# Patient Record
Sex: Male | Born: 1995 | Race: Black or African American | Hispanic: No | Marital: Single | Smoking: Current some day smoker
Health system: Southern US, Community
[De-identification: ages and names within clinical notes are randomized; demographics above are authoritative.]

## PROBLEM LIST (undated history)

## (undated) DIAGNOSIS — J45909 Unspecified asthma, uncomplicated: Secondary | ICD-10-CM

---

## 2014-02-08 ENCOUNTER — Emergency Department (HOSPITAL_COMMUNITY)
Admission: EM | Admit: 2014-02-08 | Discharge: 2014-02-08 | Disposition: A | Discharge status: Home / Self Care | Payer: Self-pay | Attending: Emergency Medicine | Admitting: Emergency Medicine

## 2014-02-08 ENCOUNTER — Encounter (HOSPITAL_COMMUNITY): Payer: Self-pay | Admitting: Emergency Medicine

## 2014-02-08 ENCOUNTER — Emergency Department (HOSPITAL_COMMUNITY): Payer: Self-pay

## 2014-02-08 DIAGNOSIS — B9789 Other viral agents as the cause of diseases classified elsewhere: Secondary | ICD-10-CM

## 2014-02-08 DIAGNOSIS — J40 Bronchitis, not specified as acute or chronic: Secondary | ICD-10-CM

## 2014-02-08 DIAGNOSIS — J069 Acute upper respiratory infection, unspecified: Secondary | ICD-10-CM | POA: Insufficient documentation

## 2014-02-08 DIAGNOSIS — Z720 Tobacco use: Secondary | ICD-10-CM | POA: Insufficient documentation

## 2014-02-08 DIAGNOSIS — J45909 Unspecified asthma, uncomplicated: Secondary | ICD-10-CM | POA: Insufficient documentation

## 2014-02-08 HISTORY — DX: Unspecified asthma, uncomplicated: J45.909

## 2014-02-08 MED ORDER — PROMETHAZINE-DM 6.25-15 MG/5ML PO SYRP: 5.0000 mL | ORAL_SOLUTION | Freq: Four times a day (QID) | ORAL | Status: AC | PRN

## 2014-02-08 MED ORDER — ALBUTEROL SULFATE HFA 108 (90 BASE) MCG/ACT IN AERS
2.0000 | INHALATION_SPRAY | RESPIRATORY_TRACT | Status: DC | PRN
  Administered 2014-02-08: 2 via RESPIRATORY_TRACT
  Filled 2014-02-08: qty 6.7

## 2014-02-08 MED ORDER — IPRATROPIUM BROMIDE 0.02 % IN SOLN
0.5000 mg | Freq: Once | RESPIRATORY_TRACT | Status: AC
  Administered 2014-02-08: 0.5 mg via RESPIRATORY_TRACT
  Filled 2014-02-08: qty 2.5

## 2014-02-08 MED ORDER — PREDNISONE 20 MG PO TABS
60.0000 mg | ORAL_TABLET | Freq: Once | ORAL | Status: AC
  Administered 2014-02-08: 60 mg via ORAL
  Filled 2014-02-08: qty 3

## 2014-02-08 MED ORDER — PREDNISONE 50 MG PO TABS: 50.0000 mg | ORAL_TABLET | Freq: Every day | ORAL | Status: DC

## 2014-02-08 MED ORDER — ALBUTEROL SULFATE (2.5 MG/3ML) 0.083% IN NEBU
5.0000 mg | INHALATION_SOLUTION | Freq: Once | RESPIRATORY_TRACT | Status: AC
  Administered 2014-02-08: 5 mg via RESPIRATORY_TRACT
  Filled 2014-02-08: qty 6

## 2014-02-08 NOTE — ED Notes (Signed)
Finished TReatment.

## 2014-02-08 NOTE — ED Notes (Signed)
Pt reports productive cough with yellow sputum x 3 weeks. Denies CP, SOB. Denies fever/chills. Pts lung clear. 100% RA.

## 2014-02-08 NOTE — ED Provider Notes (Signed)
Medical screening examination/treatment/procedure(s) were performed by non-physician practitioner and as supervising physician I was immediately available for consultation/collaboration.   EKG Interpretation None        Courtney F Horton, MD 02/08/14 1835 

## 2014-02-08 NOTE — Discharge Instructions (Signed)
Return here as needed. Follow up with a primary doctor or urgent care. Your chest x-ray was normal. Increase your fluid intake. Rest as much as possible.

## 2014-02-08 NOTE — ED Provider Notes (Signed)
CSN: [id number]     Arrival date & time 02/08/14  1303 History   First MD Initiated Contact with Patient 02/08/14 1314  This chart was scribed for non-physician practitioner, Charlestine Nighthristopher Breshay Ilg, PA-C, working with Shon Batonourtney F Horton, MD by Charline BillsEssence Howell, ED Scribe. This patient was seen in room TR06C/TR06C and the patient's care was started at 1:16 PM.   Chief Complaint  Patient presents with  . Cough   The history is provided by the patient. No language interpreter was used.   HPI Comments: Martin Owens is a 18 y.o. male, with a h/o asthma, who presents to the Emergency Department complaining of gradually worsening productive cough over the past 3 weeks. He reports associated rhinorrhea, sore throat, HA. Pt has tried Theraflu and Mucinex without relief. Pt has a PCP but not in this area. Allergy to Tusisn.  Past Medical History  Diagnosis Date  . Asthma    History reviewed. No pertinent past surgical history. No family history on file. History  Substance Use Topics  . Smoking status: Current Every Day Smoker -- 1.00 packs/day    Types: Cigarettes  . Smokeless tobacco: Not on file  . Alcohol Use: No    Review of Systems  HENT: Positive for rhinorrhea and sore throat.   Respiratory: Positive for cough.   Neurological: Positive for headaches.  All other systems reviewed and are negative.  Allergies  Tussin  Home Medications   Prior to Admission medications   Not on File   Triage Vitals: BP 148/69  Pulse 74  Temp(Src) 98.4 F (36.9 C) (Oral)  Resp 18  SpO2 100% Physical Exam  Nursing note and vitals reviewed. Constitutional: He is oriented to person, place, and time. He appears well-developed and well-nourished.  HENT:  Head: Normocephalic and atraumatic.  Mouth/Throat: Oropharynx is clear and moist.  Eyes: Pupils are equal, round, and reactive to light.  Neck: Normal range of motion. Neck supple.  Cardiovascular: Normal rate, regular rhythm and normal heart sounds.    Pulmonary/Chest: Effort normal. He has no wheezes.  Coarse breath sounds  Musculoskeletal: Normal range of motion.  Neurological: He is alert and oriented to person, place, and time.  Skin: Skin is warm and dry.  Psychiatric: He has a normal mood and affect. His behavior is normal.   ED Course  Procedures (including critical care time) DIAGNOSTIC STUDIES: Oxygen Saturation is 100% on RA, normal by my interpretation.    COORDINATION OF CARE: 1:18 PM-Discussed treatment plan which includes CXR with pt at bedside and pt agreed to plan.   Patient will be treated for URI. Told to return here as needed. Follow up with a PCP or urgent care. Imaging Review Dg Chest 2 View  02/08/2014   CLINICAL DATA:  Cough for 3 weeks.  Smoker.  EXAM: CHEST  2 VIEW  COMPARISON:  None.  FINDINGS: Heart size and mediastinal contours are within normal limits. Both lungs are clear. Visualized skeletal structures are unremarkable.  IMPRESSION: Negative exam.   Electronically Signed   By: Drusilla Kannerhomas  Dalessio M.D.   On: 02/08/2014 14:29      I personally performed the services described in this documentation, which was scribed in my presence. The recorded information has been reviewed and is accurate.    Carlyle Dollyhristopher W Guiseppe Flanagan, PA-C 02/08/14 1434

## 2014-02-08 NOTE — ED Notes (Signed)
PA at the bedside.

## 2015-08-10 ENCOUNTER — Emergency Department (HOSPITAL_COMMUNITY)
Admission: EM | Admit: 2015-08-10 | Discharge: 2015-08-10 | Disposition: A | Discharge status: Home / Self Care | Payer: Self-pay | Attending: Emergency Medicine | Admitting: Emergency Medicine

## 2015-08-10 ENCOUNTER — Emergency Department (HOSPITAL_COMMUNITY): Payer: Self-pay

## 2015-08-10 ENCOUNTER — Encounter (HOSPITAL_COMMUNITY): Payer: Self-pay

## 2015-08-10 DIAGNOSIS — J9801 Acute bronchospasm: Secondary | ICD-10-CM

## 2015-08-10 DIAGNOSIS — J45901 Unspecified asthma with (acute) exacerbation: Secondary | ICD-10-CM | POA: Insufficient documentation

## 2015-08-10 DIAGNOSIS — B349 Viral infection, unspecified: Secondary | ICD-10-CM | POA: Insufficient documentation

## 2015-08-10 DIAGNOSIS — Z7952 Long term (current) use of systemic steroids: Secondary | ICD-10-CM | POA: Insufficient documentation

## 2015-08-10 DIAGNOSIS — R109 Unspecified abdominal pain: Secondary | ICD-10-CM | POA: Insufficient documentation

## 2015-08-10 DIAGNOSIS — F17210 Nicotine dependence, cigarettes, uncomplicated: Secondary | ICD-10-CM | POA: Insufficient documentation

## 2015-08-10 DIAGNOSIS — R630 Anorexia: Secondary | ICD-10-CM | POA: Insufficient documentation

## 2015-08-10 MED ORDER — ALBUTEROL SULFATE HFA 108 (90 BASE) MCG/ACT IN AERS
2.0000 | INHALATION_SPRAY | RESPIRATORY_TRACT | Status: DC | PRN
  Administered 2015-08-10: 2 via RESPIRATORY_TRACT
  Filled 2015-08-10: qty 6.7

## 2015-08-10 MED ORDER — PREDNISONE 20 MG PO TABS: ORAL_TABLET | ORAL | Status: AC

## 2015-08-10 MED ORDER — BENZONATATE 100 MG PO CAPS: 100.0000 mg | ORAL_CAPSULE | Freq: Three times a day (TID) | ORAL | Status: AC

## 2015-08-10 NOTE — ED Provider Notes (Signed)
CSN: [id number]     Arrival date & time 08/10/15  1208 History  By signing my name below, I, Martin Owens, attest that this documentation has been prepared under the direction and in the presence of non-physician practitioner, [name], PA-C. Electronically Signed: [name], Scribe. 08/10/2015. 1:48 PM.    Chief Complaint  Patient presents with  . Cough  . Nasal Congestion   The history is provided by the patient. No language interpreter was used.     HPI Comments:  Martin Owens is a 20 y.o. male with a history of asthma, who presents to the Emergency Department complaining of persistent dry cough x 1 week.  He reports associated wheezing, subjective fever, CP secondary to cough, sore throat, HA, abdominal pain, decrease appetite and an episode of epistaxis that has resolved at this time. Pt is a current everyday smoker.  He denies h/o intubation or ICU hospitalization secondary to asthma. No alleviating factors noted; no treatments tried.  Past Medical History  Diagnosis Date  . Asthma    History reviewed. No pertinent past surgical history. No family history on file. Social History  Substance Use Topics  . Smoking status: Current Every Day Smoker -- 1.00 packs/day    Types: Cigarettes  . Smokeless tobacco: None  . Alcohol Use: No    Review of Systems  Constitutional: Positive for fever and appetite change.  HENT: Positive for nosebleeds (resolved) and sore throat.   Respiratory: Positive for cough and wheezing.   Cardiovascular: Positive for chest pain.  Gastrointestinal: Positive for abdominal pain. Negative for vomiting.  Neurological: Positive for headaches.  All other systems reviewed and are negative.   Allergies  Tussin  Home Medications   Prior to Admission medications   Medication Sig Start Date End Date Taking? Authorizing Provider  predniSONE (DELTASONE) 50 MG tablet Take 1 tablet (50 mg total) by mouth daily. 02/08/14   Charlestine Night, PA-C   promethazine-dextromethorphan (PROMETHAZINE-DM) 6.25-15 MG/5ML syrup Take 5 mLs by mouth 4 (four) times daily as needed for cough. 02/08/14   Christopher Lawyer, PA-C   BP 104/71 mmHg  Pulse 86  Temp(Src) 99.3 F (37.4 C) (Oral)  Resp 18  Ht  (1.727 m)  Wt 158 lb 4.8 oz (71.804 kg)  BMI 24.07 kg/m2  SpO2 97% Physical Exam  Constitutional: He is oriented to person, place, and time. He appears well-developed and well-nourished. No distress.  HENT:  Head: Normocephalic and atraumatic.  Right Ear: Tympanic membrane and external ear normal.  Left Ear: Tympanic membrane and external ear normal.  Eyes: Conjunctivae are normal.  Cardiovascular: Normal rate.   Pulmonary/Chest: Effort normal. He has wheezes (expiratory). He has rhonchi.  Abdominal: He exhibits no distension.  Lymphadenopathy:    He has cervical adenopathy.  Neurological: He is alert and oriented to person, place, and time.  Skin: Skin is warm and dry.  Psychiatric: He has a normal mood and affect.  Nursing note and vitals reviewed.   ED Course  Procedures DIAGNOSTIC STUDIES:  Oxygen Saturation is 97% on RA, normal by my interpretation.    COORDINATION OF CARE:  1:48 PM Discussed treatment plan with pt at bedside and pt agreed to plan.  Imaging Review Dg Chest 2 View  08/10/2015  CLINICAL DATA:  Cold chills,body aches,cough for 1 week,hx asthma,smoker EXAM: CHEST - 2 VIEW COMPARISON:  02/08/2014 FINDINGS: Lungs are clear. Heart size and mediastinal contours are within normal limits. No effusion. Visualized skeletal structures are unremarkable. IMPRESSION: No  acute cardiopulmonary disease. Electronically Signed   By: Corlis Leak  Hassell M.D.   On: 08/10/2015 14:15   I have personally reviewed and evaluated these images and lab results as part of my medical decision-making.    MDM  Pt symptoms consistent with URI. CXR negative for acute infiltrate. Pt will be discharged with symptomatic treatment including albuterol  and prednisone along with cough medication.  Discussed return precautions.  Pt is hemodynamically stable & in NAD prior to discharge.  Final diagnoses:  Acute bronchospasm due to viral infection    BP 104/71 mmHg  Pulse 86  Temp(Src) 99.3 F (37.4 C) (Oral)  Resp 18  Ht 5\' 8"  (1.727 m)  Wt 71.804 kg  BMI 24.07 kg/m2  SpO2 97%   I personally performed the services described in this documentation, which was scribed in my presence. The recorded information has been reviewed and is accurate.      Fayrene HelperBowie Dannilynn Gallina, PA-C 08/10/15 1450  Margarita Grizzleanielle Ray, MD 08/10/15 [phone]

## 2015-08-10 NOTE — ED Notes (Signed)
Patient here with 1 week of cough, congestion and nosebleeds, no distress

## 2015-08-10 NOTE — Discharge Instructions (Signed)
Asthma, Acute Bronchospasm °Acute bronchospasm caused by asthma is also referred to as an asthma attack. Bronchospasm means your air passages become narrowed. The narrowing is caused by inflammation and tightening of the muscles in the air tubes (bronchi) in your lungs. This can make it hard to breathe or cause you to wheeze and cough. °CAUSES °Possible triggers are: °· Animal dander from the skin, hair, or feathers of animals. °· Dust mites contained in house dust. °· Cockroaches. °· Pollen from trees or grass. °· Mold. °· Cigarette or tobacco smoke. °· Air pollutants such as dust, household cleaners, hair sprays, aerosol sprays, paint fumes, strong chemicals, or strong odors. °· Cold air or weather changes. Cold air may trigger inflammation. Winds increase molds and pollens in the air. °· Strong emotions such as crying or laughing hard. °· Stress. °· Certain medicines such as aspirin or beta-blockers. °· Sulfites in foods and drinks, such as dried fruits and wine. °· Infections or inflammatory conditions, such as a flu, cold, or inflammation of the nasal membranes (rhinitis). °· Gastroesophageal reflux disease (GERD). GERD is a condition where stomach acid backs up into your esophagus. °· Exercise or strenuous activity. °SIGNS AND SYMPTOMS  °· Wheezing. °· Excessive coughing, particularly at night. °· Chest tightness. °· Shortness of breath. °DIAGNOSIS  °Your health care provider will ask you about your medical history and perform a physical exam. A chest X-ray or blood testing may be performed to look for other causes of your symptoms or other conditions that may have triggered your asthma attack.  °TREATMENT  °Treatment is aimed at reducing inflammation and opening up the airways in your lungs.  Most asthma attacks are treated with inhaled medicines. These include quick relief or rescue medicines (such as bronchodilators) and controller medicines (such as inhaled corticosteroids). These medicines are sometimes  given through an inhaler or a nebulizer. Systemic steroid medicine taken by mouth or given through an IV tube also can be used to reduce the inflammation when an attack is moderate or severe. Antibiotic medicines are only used if a bacterial infection is present.  °HOME CARE INSTRUCTIONS  °· Rest. °· Drink plenty of liquids. This helps the mucus to remain thin and be easily coughed up. Only use caffeine in moderation and do not use alcohol until you have recovered from your illness. °· Do not smoke. Avoid being exposed to secondhand smoke. °· You play a critical role in keeping yourself in good health. Avoid exposure to things that cause you to wheeze or to have breathing problems. °· Keep your medicines up-to-date and available. Carefully follow your health care provider's treatment plan. °· Take your medicine exactly as prescribed. °· When pollen or pollution is bad, keep windows closed and use an air conditioner or go to places with air conditioning. °· Asthma requires careful medical care. See your health care provider for a follow-up as advised. If you are more than [redacted] weeks pregnant and you were prescribed any new medicines, let your obstetrician know about the visit and how you are doing. Follow up with your health care provider as directed. °· After you have recovered from your asthma attack, make an appointment with your outpatient doctor to talk about ways to reduce the likelihood of future attacks. If you do not have a doctor who manages your asthma, make an appointment with a primary care doctor to discuss your asthma. °SEEK IMMEDIATE MEDICAL CARE IF:  °· You are getting worse. °· You have trouble breathing. If severe, call your local   emergency services (911 in the [place] °· You develop chest pain or discomfort. °· You are vomiting. °· You are not able to keep fluids down. °· You are coughing up yellow, green, brown, or bloody sputum. °· You have a fever and your symptoms suddenly get worse. °· You have  trouble swallowing. °MAKE SURE YOU:  °· Understand these instructions. °· Will watch your condition. °· Will get help right away if you are not doing well or get worse. °  °This information is not intended to replace advice given to you by your health care provider. Make sure you discuss any questions you have with your health care provider. °  °Document Released: 08/08/2006 Document Revised: 04/28/2013 Document Reviewed: 10/29/2012 °Elsevier Interactive Patient Education ©2016 [employer] ° °Viral Infections °A virus is a type of germ. Viruses can cause: °· Minor sore throats. °· Aches and pains. °· Headaches. °· Runny nose. °· Rashes. °· Watery eyes. °· Tiredness. °· Coughs. °· Loss of appetite. °· Feeling sick to your stomach (nausea). °· Throwing up (vomiting). °· Watery poop (diarrhea). °HOME CARE  °· Only take medicines as told by your doctor. °· Drink enough water and fluids to keep your pee (urine) clear or pale yellow. Sports drinks are a good choice. °· Get plenty of rest and eat healthy. Soups and broths with crackers or rice are fine. °GET HELP RIGHT AWAY IF:  °· You have a very bad headache. °· You have shortness of breath. °· You have chest pain or neck pain. °· You have an unusual rash. °· You cannot stop throwing up. °· You have watery poop that does not stop. °· You cannot keep fluids down. °· You or your child has a temperature by mouth above 102° F (38.9° C), not controlled by medicine. °· Your baby is older than 3 months with a rectal temperature of 102° F (38.9° C) or higher. °· Your baby is 3 months old or younger with a rectal temperature of 100.4° F (38° C) or higher. °MAKE SURE YOU:  °· Understand these instructions. °· Will watch this condition. °· Will get help right away if you are not doing well or get worse. °  °This information is not intended to replace advice given to you by your health care provider. Make sure you discuss any questions you have with your health care provider. °    °Document Released: 04/05/2008 Document Revised: 07/16/2011 Document Reviewed: 09/29/2014 °Elsevier Interactive Patient Education ©2016 [employer] ° °

## 2017-01-05 ENCOUNTER — Emergency Department (HOSPITAL_COMMUNITY)
Admission: EM | Admit: 2017-01-05 | Discharge: 2017-01-05 | Disposition: A | Discharge status: Home / Self Care | Payer: Self-pay | Attending: Emergency Medicine | Admitting: Emergency Medicine

## 2017-01-05 ENCOUNTER — Encounter (HOSPITAL_COMMUNITY): Payer: Self-pay | Admitting: *Deleted

## 2017-01-05 ENCOUNTER — Emergency Department (HOSPITAL_COMMUNITY): Payer: Self-pay

## 2017-01-05 DIAGNOSIS — J45909 Unspecified asthma, uncomplicated: Secondary | ICD-10-CM | POA: Insufficient documentation

## 2017-01-05 DIAGNOSIS — R112 Nausea with vomiting, unspecified: Secondary | ICD-10-CM | POA: Insufficient documentation

## 2017-01-05 DIAGNOSIS — F17210 Nicotine dependence, cigarettes, uncomplicated: Secondary | ICD-10-CM | POA: Insufficient documentation

## 2017-01-05 DIAGNOSIS — R1030 Lower abdominal pain, unspecified: Secondary | ICD-10-CM | POA: Insufficient documentation

## 2017-01-05 LAB — CBC WITH DIFFERENTIAL/PLATELET
BASOS ABS: 0 10*3/uL (ref 0.0–0.1)
Basophils Relative: 0 %
Eosinophils Absolute: 0.2 10*3/uL (ref 0.0–0.7)
Eosinophils Relative: 5 %
HCT: 42.1 % (ref 39.0–52.0)
Hemoglobin: 14 g/dL (ref 13.0–17.0)
LYMPHS PCT: 42 %
Lymphs Abs: 1.9 10*3/uL (ref 0.7–4.0)
MCH: 28.5 pg (ref 26.0–34.0)
MCHC: 33.3 g/dL (ref 30.0–36.0)
MCV: 85.6 fL (ref 78.0–100.0)
Monocytes Absolute: 0.4 10*3/uL (ref 0.1–1.0)
Monocytes Relative: 8 %
Neutro Abs: 2.1 10*3/uL (ref 1.7–7.7)
Neutrophils Relative %: 45 %
Platelets: 250 10*3/uL (ref 150–400)
RBC: 4.92 MIL/uL (ref 4.22–5.81)
RDW: 14 % (ref 11.5–15.5)
WBC: 4.6 10*3/uL (ref 4.0–10.5)

## 2017-01-05 LAB — COMPREHENSIVE METABOLIC PANEL
ALBUMIN: 4.1 g/dL (ref 3.5–5.0)
ALT: 24 U/L (ref 17–63)
AST: 29 U/L (ref 15–41)
Alkaline Phosphatase: 46 U/L (ref 38–126)
Anion gap: 7 (ref 5–15)
BUN: 11 mg/dL (ref 6–20)
CO2: 26 mmol/L (ref 22–32)
Calcium: 9.3 mg/dL (ref 8.9–10.3)
Chloride: 108 mmol/L (ref 101–111)
Creatinine, Ser: 1.22 mg/dL (ref 0.61–1.24)
GFR calc Af Amer: >60 mL/min (ref >60)
GFR calc non Af Amer: >60 mL/min (ref >60)
Glucose, Bld: 91 mg/dL (ref 65–99)
POTASSIUM: 4.1 mmol/L (ref 3.5–5.1)
Sodium: 141 mmol/L (ref 135–145)
Total Bilirubin: 0.5 mg/dL (ref 0.3–1.2)
Total Protein: 6.8 g/dL (ref 6.5–8.1)

## 2017-01-05 MED ORDER — SODIUM CHLORIDE 0.9 % IV BOLUS (SEPSIS): 1000.0000 mL | Freq: Once | INTRAVENOUS | Status: DC

## 2017-01-05 MED ORDER — ONDANSETRON HCL 4 MG/2ML IJ SOLN
4.0000 mg | Freq: Once | INTRAMUSCULAR | Status: AC
Start: 2017-01-05 — End: 2017-01-05
  Administered 2017-01-05: 4 mg via INTRAVENOUS
  Filled 2017-01-05: qty 2

## 2017-01-05 MED ORDER — KETOROLAC TROMETHAMINE 30 MG/ML IJ SOLN
30.0000 mg | Freq: Once | INTRAMUSCULAR | Status: AC
  Administered 2017-01-05: 30 mg via INTRAVENOUS
  Filled 2017-01-05: qty 1

## 2017-01-05 MED ORDER — ONDANSETRON 4 MG PO TBDP: 4.0000 mg | ORAL_TABLET | Freq: Three times a day (TID) | ORAL | 0 refills | Status: AC | PRN

## 2017-01-05 MED ORDER — SODIUM CHLORIDE 0.9 % IV BOLUS (SEPSIS)
1000.0000 mL | Freq: Once | INTRAVENOUS | Status: AC
  Administered 2017-01-05: 1000 mL via INTRAVENOUS

## 2017-01-05 MED ORDER — DICYCLOMINE HCL 20 MG PO TABS: 20.0000 mg | ORAL_TABLET | Freq: Two times a day (BID) | ORAL | 0 refills | Status: AC

## 2017-01-05 MED ORDER — IOPAMIDOL (ISOVUE-300) INJECTION 61%
INTRAVENOUS | Status: AC
  Administered 2017-01-05: 100 mL
  Filled 2017-01-05: qty 100

## 2017-01-05 NOTE — ED Notes (Signed)
Patient transported to CT 

## 2017-01-05 NOTE — ED Notes (Signed)
PT denies pain and nausea at this time.  Tolerated po challenge.  PA notified.

## 2017-01-05 NOTE — ED Notes (Signed)
Pt states reduced nausea and pain and states he is ready to go home.  Given po challenge of ginger ale.

## 2017-01-05 NOTE — Discharge Instructions (Signed)
There were no acute abnormalities on the labs or CT scan, including no signs of appendicitis. Your symptoms are consistent with a viral illness. Viruses do not require antibiotics. Treatment is symptomatic care and it is important to note that these symptoms may last for 7-10 days.   Hand washing: Wash your hands throughout the day, but especially before and after touching the face, using the restroom, sneezing, coughing, or touching surfaces that have been coughed or sneezed upon. Hydration: Symptoms will be intensified and complicated by dehydration. Dehydration can also extend the duration of symptoms. Drink plenty of fluids and get plenty of rest. You should be drinking at least half a liter of water an hour to stay hydrated. Electrolyte drinks are also encouraged. You should be drinking enough fluids to make your urine light yellow, almost clear. If this is not the case, you are not drinking enough water. Please note that some of the treatments indicated below will not be effective if you are not adequately hydrated. Pain or fever: Ibuprofen, Naproxen, or Tylenol for pain or fever.  Nausea/vomiting: Use the Zofran for nausea or vomiting.  Bentyl: May use the Bentyl as needed for abdominal discomfort. Follow up: Follow up with a primary care provider, as needed, for any future management of this issue.

## 2017-01-05 NOTE — ED Provider Notes (Signed)
[facility]-EMERGENCY DEPT Provider Note   CSN: [id number] Arrival date & time: 01/05/17  0743     History   Chief Complaint Chief Complaint  Patient presents with  . Emesis  . Headache    HPI Martin Owens is a 21 y.o. male.  The history is provided by the patient.     Martin Owens is a 21 y.o. male, with a history of Asthma, presenting to the ED with Nausea and vomiting for the last 3 days. States he vomits every times he tries to eat or drink and has nausea in between. Also endorses bilateral lower abdominal non-worsening soreness that seems to rise after vomiting. Rates this 7/10, nonradiating. Endorses a temporary headache after vomiting that resolves within a few minutes. Headache is bilateral, throbbing, global, nonradiating. The headache does not arise before vomiting. He has not tried any therapies for his complaints.  Denies diarrhea/constipation, fever, urinary complaints, genital complaints, chest pain, shortness of breath, neck pain/stiffness, neuro deficits, or any other complaints.   Past Medical History:  Diagnosis Date  . Asthma     There are no active problems to display for this patient.   History reviewed. No pertinent surgical history.     Home Medications    Prior to Admission medications   Medication Sig Start Date End Date Taking? Authorizing Provider  benzonatate (TESSALON) 100 MG capsule Take 1 capsule (100 mg total) by mouth every 8 (eight) hours. 08/10/15   [name], PA-C  dicyclomine (BENTYL) 20 MG tablet Take 1 tablet (20 mg total) by mouth 2 (two) times daily. 01/05/17   Benyamin Jeff C, PA-C  ondansetron (ZOFRAN ODT) 4 MG disintegrating tablet Take 1 tablet (4 mg total) by mouth every 8 (eight) hours as needed for nausea or vomiting. 01/05/17   Fynlee Rowlands C, PA-C  predniSONE (DELTASONE) 20 MG tablet 3 tabs po day one, then 2 po daily x 4 days 08/10/15   [name], PA-C  promethazine-dextromethorphan (PROMETHAZINE-DM) 6.25-15 MG/5ML  syrup Take 5 mLs by mouth 4 (four) times daily as needed for cough. 02/08/14   Charlestine Night, PA-C    Family History No family history on file.  Social History Social History  Substance Use Topics  . Smoking status: Current Some Day Smoker    Packs/day: 1.00    Types: Cigarettes  . Smokeless tobacco: Never Used  . Alcohol use No     Allergies   Tussin [guaifenesin]   Review of Systems Review of Systems  Respiratory: Negative for shortness of breath.   Cardiovascular: Negative for chest pain.  Gastrointestinal: Positive for abdominal pain, nausea and vomiting. Negative for constipation and diarrhea.  Genitourinary: Negative for dysuria, frequency and hematuria.  Musculoskeletal: Negative for neck pain and neck stiffness.  Neurological: Positive for headaches. Negative for dizziness, syncope, weakness, light-headedness and numbness.  All other systems reviewed and are negative.    Physical Exam Updated Vital Signs BP (!) 141/89 (BP Location: Right Arm)   Pulse 78   Temp 98 F (36.7 C) (Oral)   Resp 16   Ht 5\' 9"  (1.753 m)   Wt 71.7 kg (158 lb)   SpO2 100%   BMI 23.33 kg/m   Physical Exam  Constitutional: He appears well-developed and well-nourished. No distress.  HENT:  Head: Normocephalic and atraumatic.  Eyes: Conjunctivae are normal.  Neck: Neck supple.  Cardiovascular: Normal rate, regular rhythm, normal heart sounds and intact distal pulses.   Pulmonary/Chest: Effort normal and breath sounds normal. No  respiratory distress.  Abdominal: Soft. Bowel sounds are normal. There is tenderness in the right lower quadrant, suprapubic area and left lower quadrant. There is no guarding.  Patient is relaxed during exam. He verbally indicates tenderness in the indicated areas, but has no other reaction. His tenderness is equal across the indicated area.  Musculoskeletal: He exhibits no edema.  Lymphadenopathy:    He has no cervical adenopathy.  Neurological: He  is alert.  Skin: Skin is warm and dry. He is not diaphoretic.  Psychiatric: He has a normal mood and affect. His behavior is normal.  Nursing note and vitals reviewed.    ED Treatments / Results  Labs (all labs ordered are listed, but only abnormal results are displayed) Labs Reviewed  COMPREHENSIVE METABOLIC PANEL  CBC WITH DIFFERENTIAL/PLATELET  URINALYSIS, ROUTINE W REFLEX MICROSCOPIC    EKG  EKG Interpretation None       Radiology Ct Abdomen Pelvis W Contrast  Result Date: 01/05/2017 CLINICAL DATA:  Lower abdominal pain with nausea and vomiting for 3 days. EXAM: CT ABDOMEN AND PELVIS WITH CONTRAST TECHNIQUE: Multidetector CT imaging of the abdomen and pelvis was performed using the standard protocol following bolus administration of intravenous contrast. CONTRAST:  100mL ISOVUE-300 IOPAMIDOL (ISOVUE-300) INJECTION 61% COMPARISON:  None. FINDINGS: Lower chest: The lung bases are clear. The heart size is normal. No significant pleural or pericardial effusion is present. Hepatobiliary: No focal liver abnormality is seen. No gallstones, gallbladder wall thickening, or biliary dilatation. Pancreas: Unremarkable. No pancreatic ductal dilatation or surrounding inflammatory changes. Spleen: Normal in size without focal abnormality. Adrenals/Urinary Tract: The adrenal glands and kidneys are normal bilaterally. Ureters and urinary bladder are within normal limits. Stomach/Bowel: The stomach and duodenum are normal. The small bowel is unremarkable. The appendix is visualized an normal. The ascending and transverse colon are within normal limits. The descending and sigmoid colon are normal. Vascular/Lymphatic: No significant vascular findings are present. No enlarged abdominal or pelvic lymph nodes. Reproductive: Prostate is unremarkable. Other: No abdominal wall hernia or abnormality. No abdominopelvic ascites. Musculoskeletal: Vertebral body heights and alignment are normal. No focal lytic or  blastic lesions are present. Ventral displacement of the coccyx is remote. IMPRESSION: 1. No acute or focal abnormality to explain the patient's lower abdominal pain, nausea, or vomiting. 2. Appearance of the appendix. 3. Remote injury of the coccyx. Electronically Signed   By: Marin Robertshristopher  Mattern M.D.   On: 01/05/2017 11:03    Procedures Procedures (including critical care time)  Medications Ordered in ED Medications  sodium chloride 0.9 % bolus 1,000 mL ( Intravenous Canceled Entry 01/05/17 1141)  sodium chloride 0.9 % bolus 1,000 mL (0 mLs Intravenous Stopped 01/05/17 1204)  ondansetron (ZOFRAN) injection 4 mg (4 mg Intravenous Given 01/05/17 0826)  ketorolac (TORADOL) 30 MG/ML injection 30 mg (30 mg Intravenous Given 01/05/17 0828)  iopamidol (ISOVUE-300) 61 % injection (100 mLs  Contrast Given 01/05/17 1024)     Initial Impression / Assessment and Plan / ED Course  I have reviewed the triage vital signs and the nursing notes.  Pertinent labs & imaging results that were available during my care of the patient were reviewed by me and considered in my medical decision making (see chart for details).  Clinical Course as of Jan 05 1213  Sat Jan 05, 2017  [id number] Patient states his pain has improved with the Toradol, however, he continues to complain now only in the right lower quadrant. Rates his pain 6/10. Patient continues to be tender in  this region. Patient has not had recurrence of his headache.  [SJ]    Clinical Course User Index [SJ] Travaris Kosh C, PA-C    Patient presents with nausea, vomiting, and abdominal pain. Patient voiced improvement over ED course with interventions. I suspected that his lower abdominal pain was muscular secondary to vomiting and initial plan included labs and serial abdominal exams. However, patient continued to have localized right lower quadrant pain and tenderness, thus a CT scan for appendicitis rule out was thought to be indicated. My suspicion for meningitis  or intracranial pathology is low in this patient.  No acute abnormalities on CT. Suspect possible viral etiology. Oral fluid challenge without difficulty. The patient was given instructions for home care as well as return precautions. Patient voices understanding of these instructions, accepts the plan, and is comfortable with discharge.   Vitals:   01/05/17 0752 01/05/17 0830 01/05/17 0845 01/05/17 0900  BP:  119/77  111/72  Pulse:  65 (!) 55 (!) 56  Resp:      Temp:      TempSrc:      SpO2:  100% 100% 100%  Weight: 71.7 kg (158 lb)     Height: 5\' 9"  (1.753 m)      Vitals:   01/05/17 0845 01/05/17 0900 01/05/17 1000 01/05/17 1154  BP:  111/72 (!) 100/55   Pulse: (!) 55 (!) 56 (!) 54   Resp:    18  Temp:      TempSrc:      SpO2: 100% 100% 100%   Weight:      Height:          Final Clinical Impressions(s) / ED Diagnoses   Final diagnoses:  Non-intractable vomiting with nausea, unspecified vomiting type  Lower abdominal pain    New Prescriptions Discharge Medication List as of 01/05/2017 11:46 AM    START taking these medications   Details  dicyclomine (BENTYL) 20 MG tablet Take 1 tablet (20 mg total) by mouth 2 (two) times daily., Starting Sat 01/05/2017, Print    ondansetron (ZOFRAN ODT) 4 MG disintegrating tablet Take 1 tablet (4 mg total) by mouth every 8 (eight) hours as needed for nausea or vomiting., Starting Sat 01/05/2017, Print         Anselm Pancoast, PA-C 01/05/17 1215    Tegeler, Canary Brim, MD 01/05/17 [phone]

## 2017-01-05 NOTE — ED Triage Notes (Addendum)
Pt states bil lower quadrant abd pain and emesis x 3 days.  He believes the vomiting is causing headaches.  Denies any changes in bowel or bladder habits.

## 2018-10-18 IMAGING — CT CT ABD-PELV W/ CM
2 of 8 series · 12 of 46 positions shown, 18 images · IV contrast (APPLIED)
Comparison: None.

CLINICAL DATA: Lower abdominal pain with nausea and vomiting for 3
days.

EXAM:
CT ABDOMEN AND PELVIS WITH CONTRAST
TECHNIQUE: Multidetector CT imaging of the abdomen and pelvis was performed
using the standard protocol following bolus administration of
intravenous contrast.
CONTRAST:  100mL R6BETN-7AA IOPAMIDOL (R6BETN-7AA) INJECTION 61%

[Series 3: abdomen 5.0 · axial · 0.69mm/px · z∈[+839,+1214]mm · 9 of 93 slices shown, 15 images]
[im 9/93  soft-tissue]
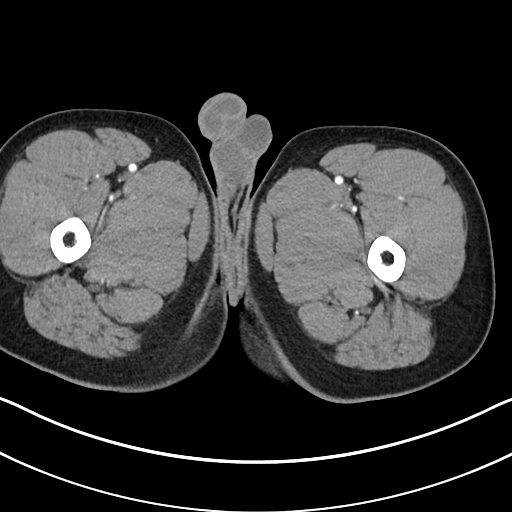
[im 9/93  bone]
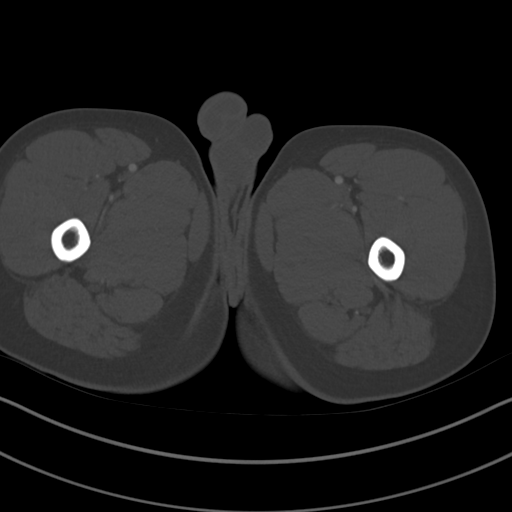
[im 17/93  soft-tissue]
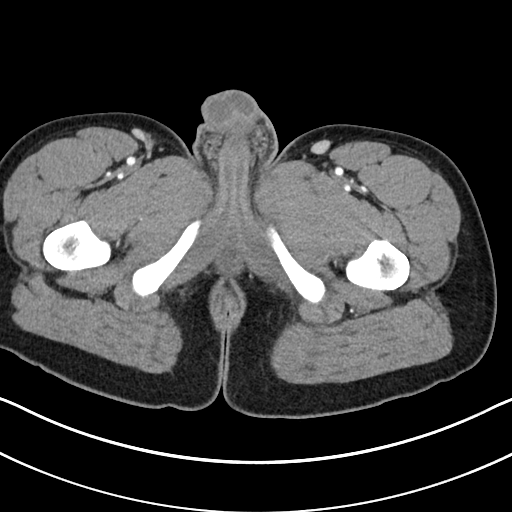
[im 26/93  soft-tissue]
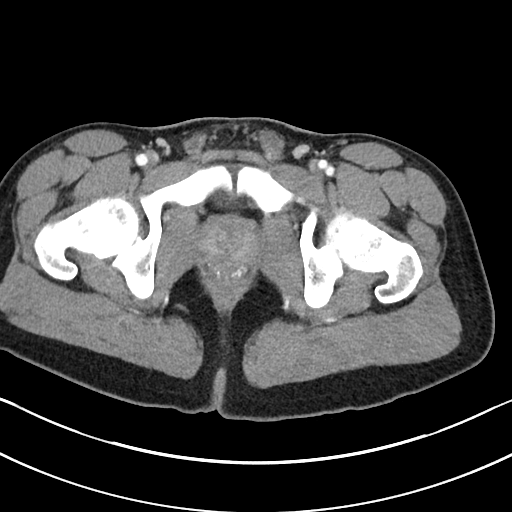
[im 34/93  soft-tissue]
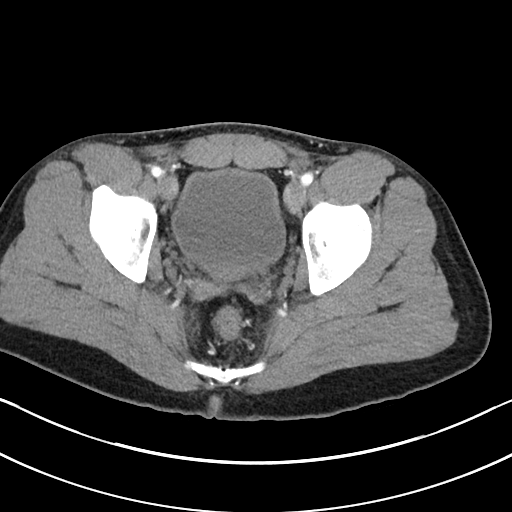
[im 51/93  soft-tissue]
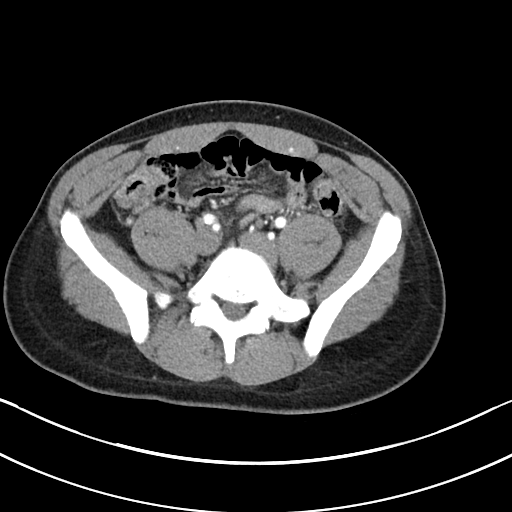
[im 59/93  soft-tissue]
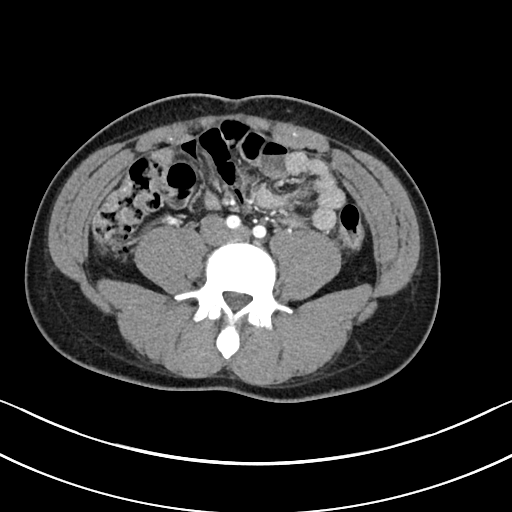
[im 59/93  lung]
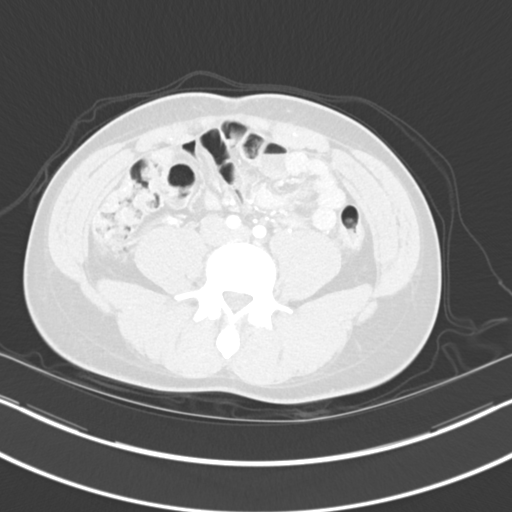
[im 67/93  soft-tissue]
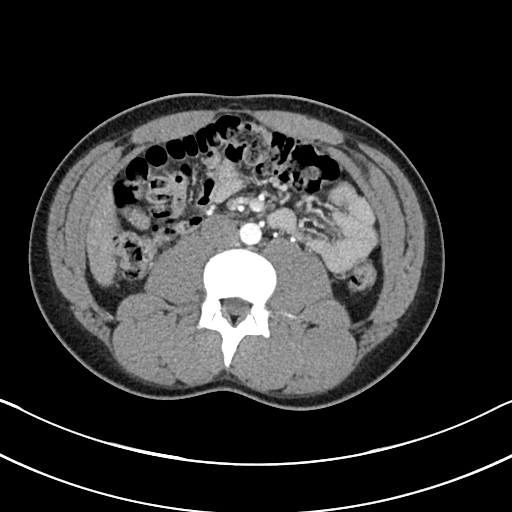
[im 67/93  lung]
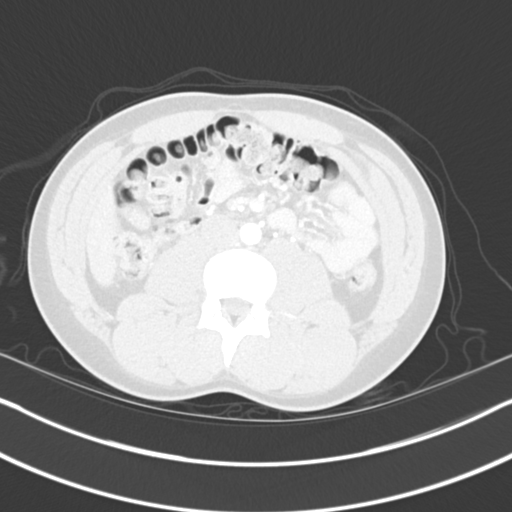
[im 76/93  soft-tissue]
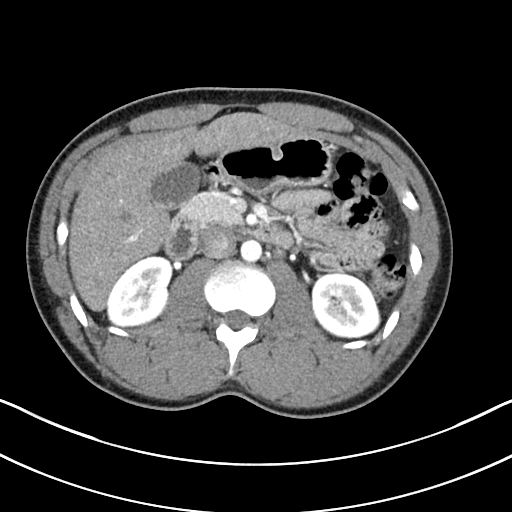
[im 76/93  lung]
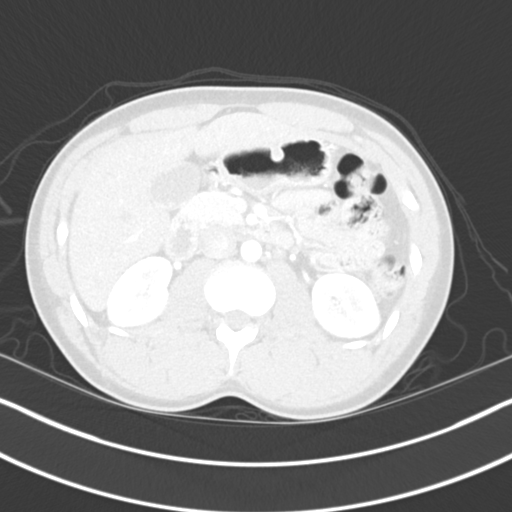
[im 84/93  soft-tissue]
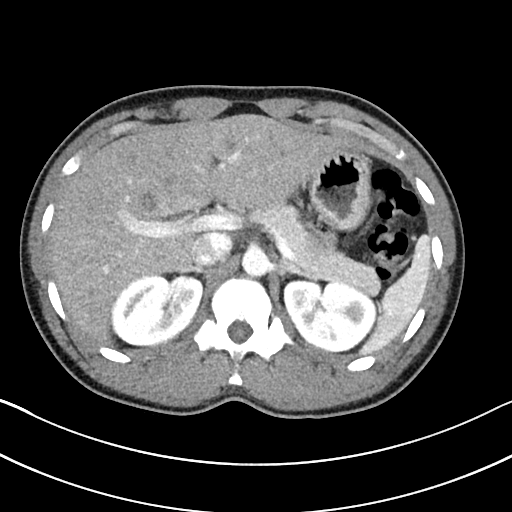
[im 84/93  lung]
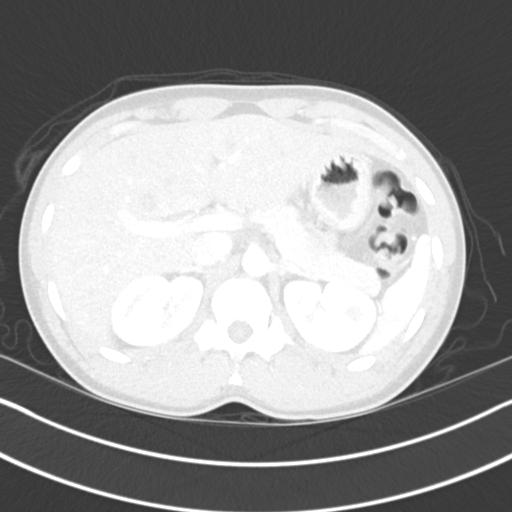
[im 84/93  bone]
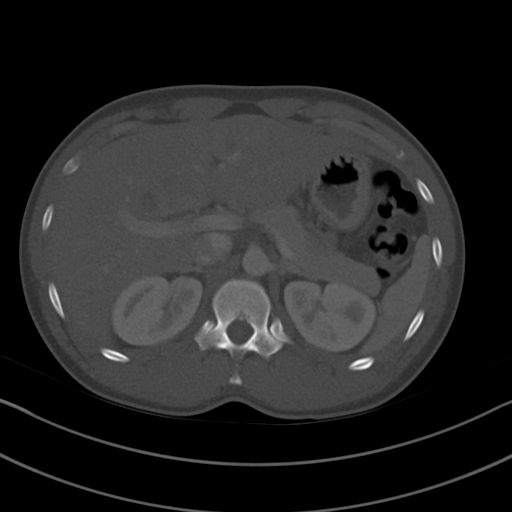

[Series 5: abdomen 3.0 mpr cor · coronal · 0.73mm/px · 3 of 79 slices shown]
[im 20/79  soft-tissue]
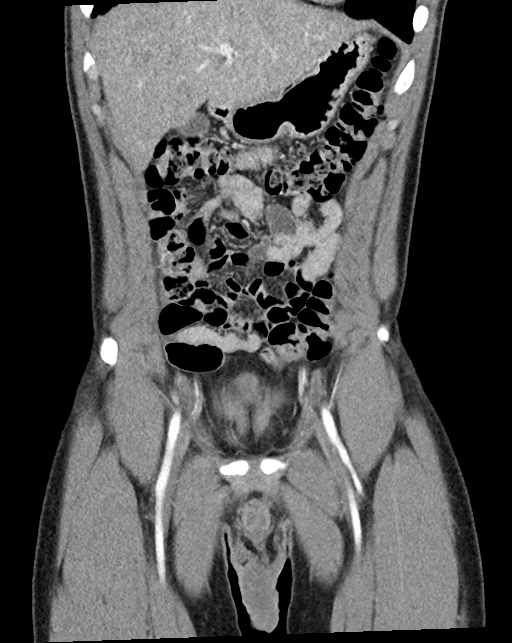
[im 40/79  soft-tissue]
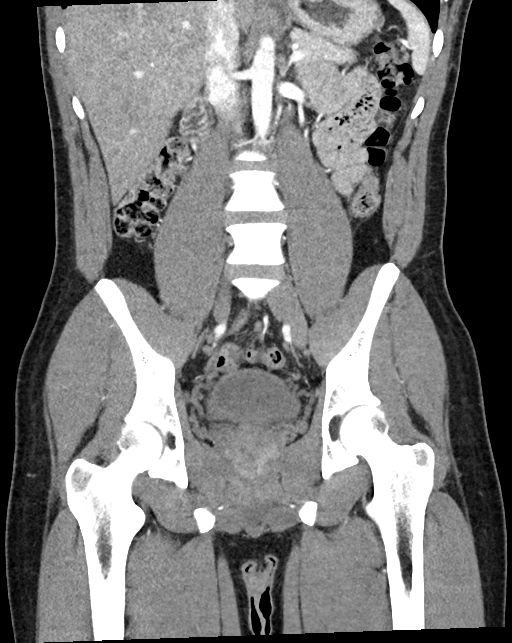
[im 59/79  soft-tissue]
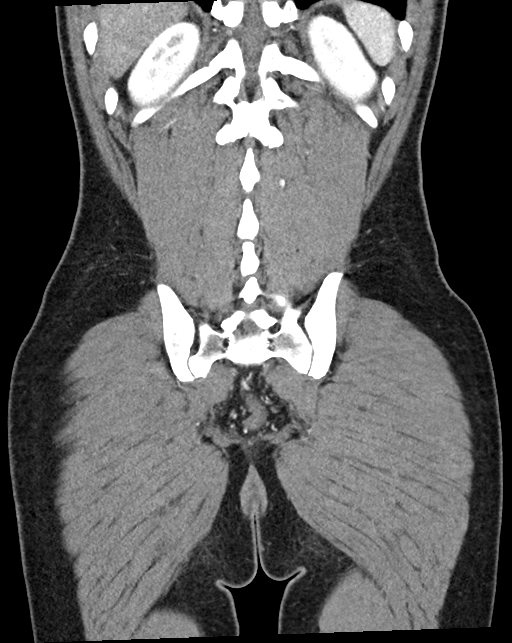

[12 of 46 positions shown; findings below may reference images not displayed]

FINDINGS: Lower chest: The lung bases are clear. The heart size is normal. No
significant pleural or pericardial effusion is present.

Hepatobiliary: No focal liver abnormality is seen. No gallstones,
gallbladder wall thickening, or biliary dilatation.

Pancreas: Unremarkable. No pancreatic ductal dilatation or
surrounding inflammatory changes.

Spleen: Normal in size without focal abnormality.

Adrenals/Urinary Tract: The adrenal glands and kidneys are normal
bilaterally. Ureters and urinary bladder are within normal limits.

Stomach/Bowel: The stomach and duodenum are normal. The small bowel
is unremarkable. The appendix is visualized an normal. The ascending
and transverse colon are within normal limits. The descending and
sigmoid colon are normal.

Vascular/Lymphatic: No significant vascular findings are present. No
enlarged abdominal or pelvic lymph nodes.

Reproductive: Prostate is unremarkable.

Other: No abdominal wall hernia or abnormality. No abdominopelvic
ascites.

Musculoskeletal: Vertebral body heights and alignment are normal. No
focal lytic or blastic lesions are present. Ventral displacement of
the coccyx is remote.
IMPRESSION: 1. No acute or focal abnormality to explain the patient's lower
abdominal pain, nausea, or vomiting.
2. Appearance of the appendix.
3. Remote injury of the coccyx.
